# Patient Record
Sex: Male | Born: 1955 | Race: White | Hispanic: No | Marital: Single | Smoking: Never smoker
Health system: Southern US, Community
[De-identification: ages and names within clinical notes are randomized; demographics above are authoritative.]

## PROBLEM LIST (undated history)

## (undated) DIAGNOSIS — I10 Essential (primary) hypertension: Secondary | ICD-10-CM

---

## 2006-12-31 ENCOUNTER — Emergency Department: Payer: Self-pay | Admitting: Emergency Medicine

## 2008-10-24 ENCOUNTER — Emergency Department: Payer: Self-pay | Admitting: Emergency Medicine

## 2008-10-29 ENCOUNTER — Ambulatory Visit: Payer: Self-pay | Admitting: Urology

## 2008-11-02 ENCOUNTER — Ambulatory Visit: Payer: Self-pay | Admitting: Urology

## 2008-11-14 ENCOUNTER — Emergency Department: Payer: Self-pay | Admitting: Emergency Medicine

## 2009-06-19 IMAGING — CT CT STONE STUDY
1 of 2 series · 15 of 32 positions shown, 19 images · non-contrast
Comparison: none

REASON FOR EXAM: flank pain
COMMENTS:

[Series 2: stone · axial · 0.69mm/px · z∈[-394,+2]mm · 15 of 150 slices shown, 19 images]
[im 12/150  soft-tissue]
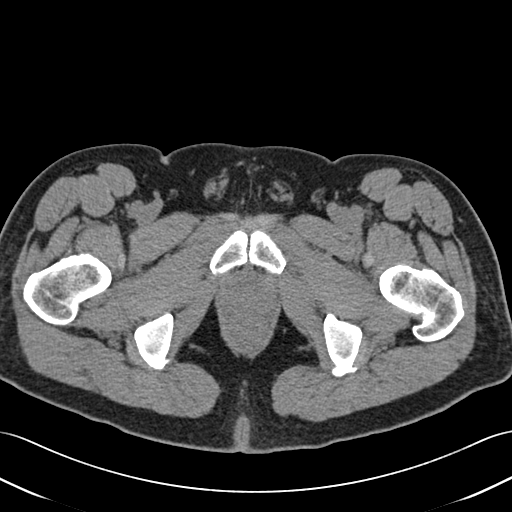
[im 12/150  bone]
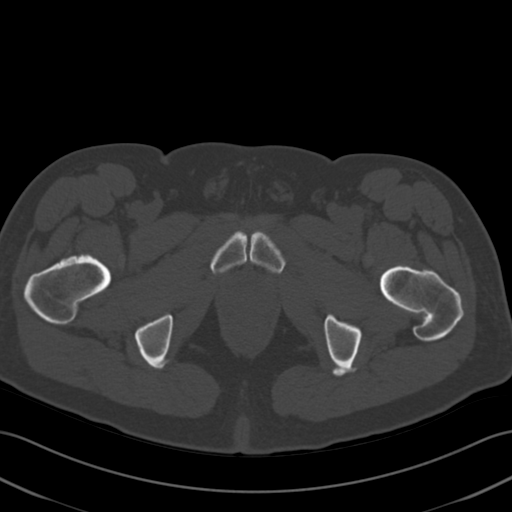
[im 23/150  soft-tissue]
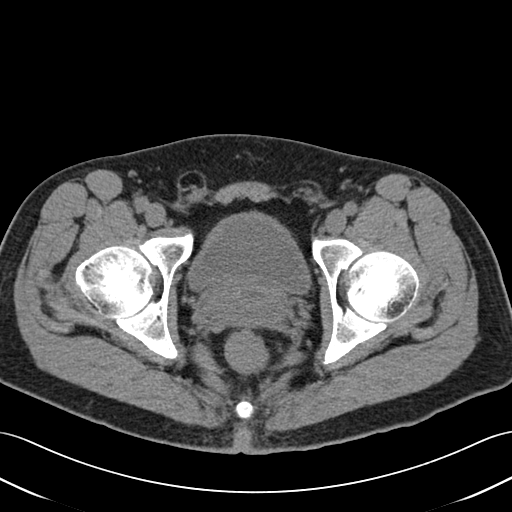
[im 34/150  soft-tissue]
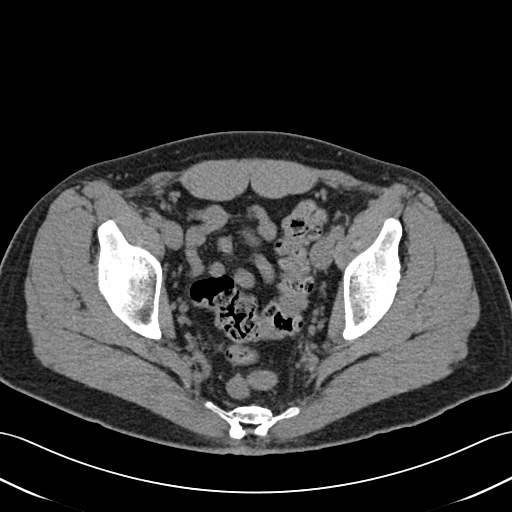
[im 45/150  soft-tissue]
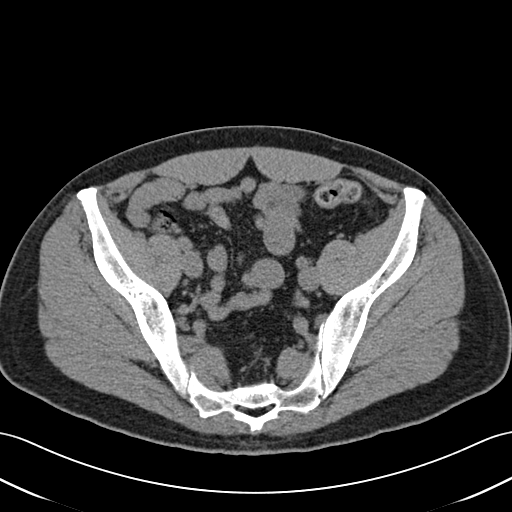
[im 56/150  soft-tissue]
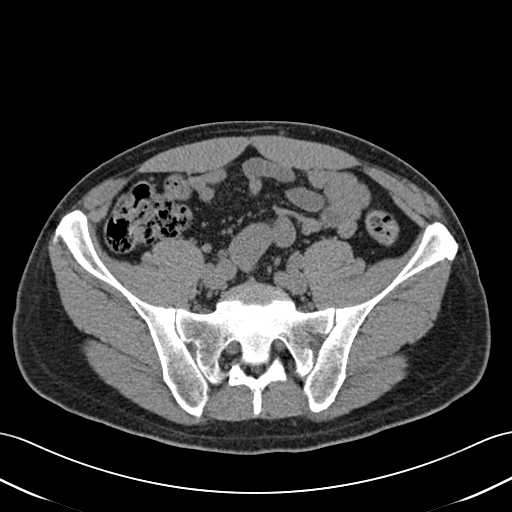
[im 67/150  soft-tissue]
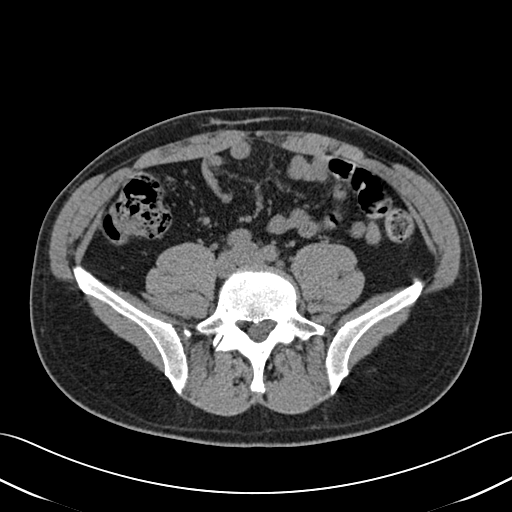
[im 78/150  soft-tissue]
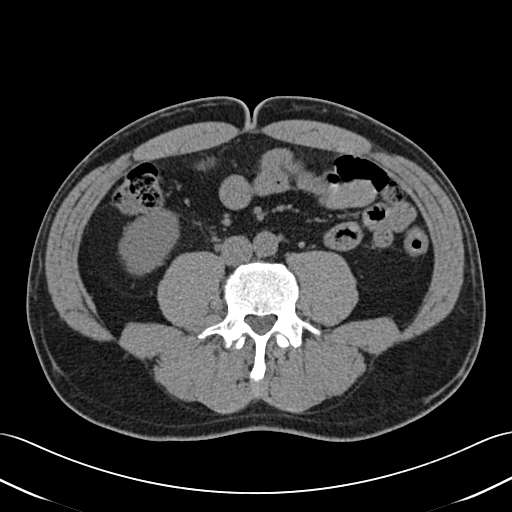
[im 89/150  soft-tissue]
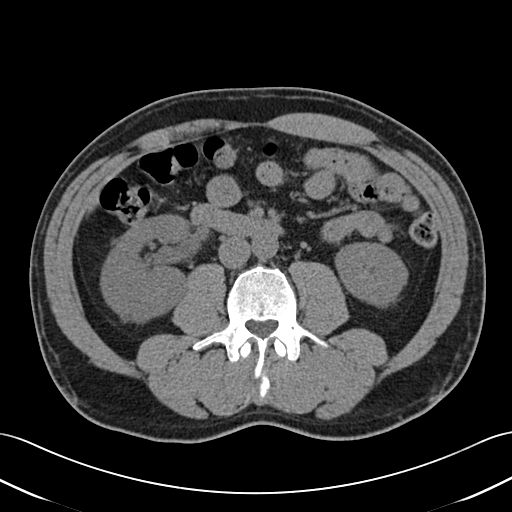
[im 100/150  soft-tissue]
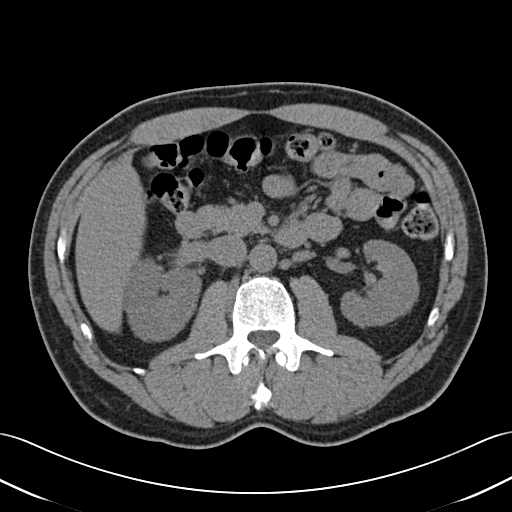
[im 100/150  bone]
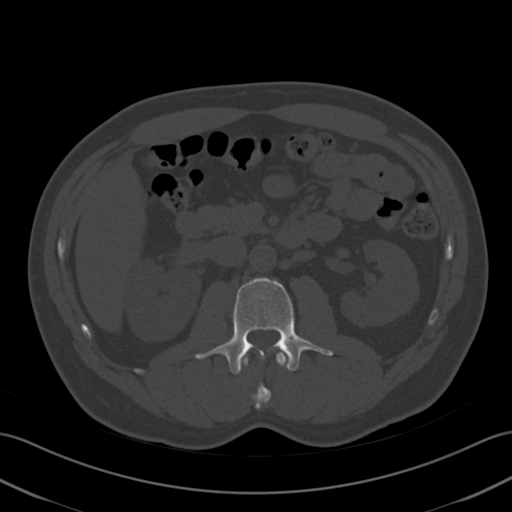
[im 111/150  soft-tissue]
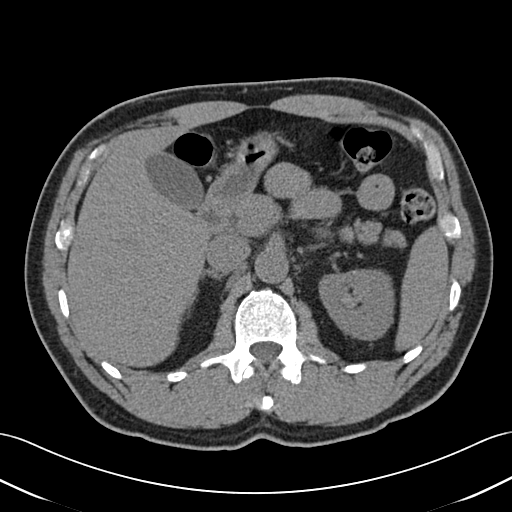
[im 122/150  soft-tissue]
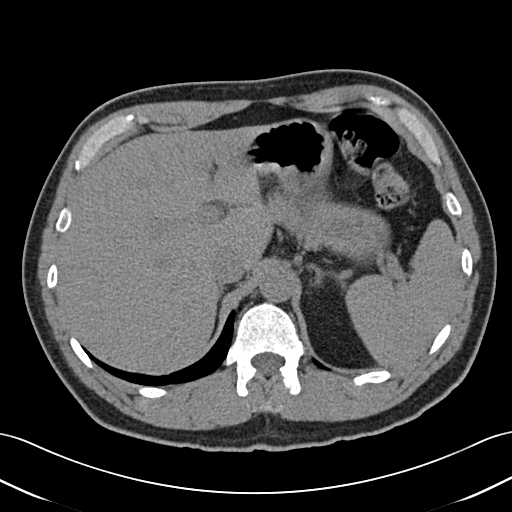
[im 127/150  lung]
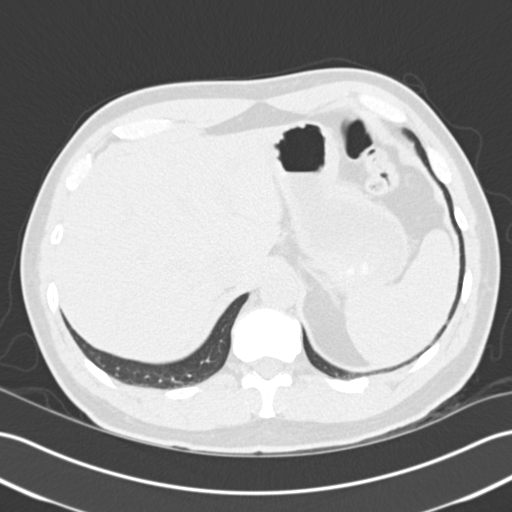
[im 133/150  soft-tissue]
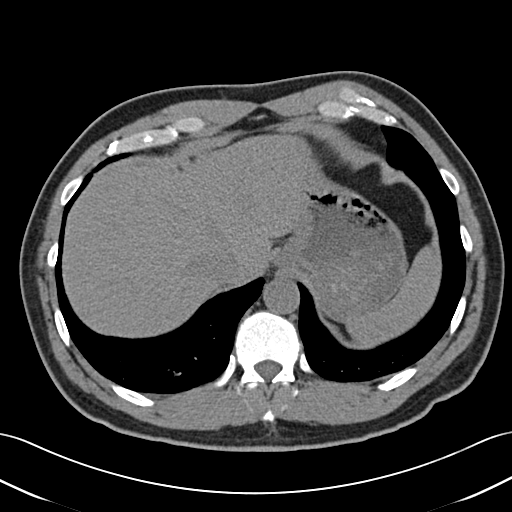
[im 133/150  lung]
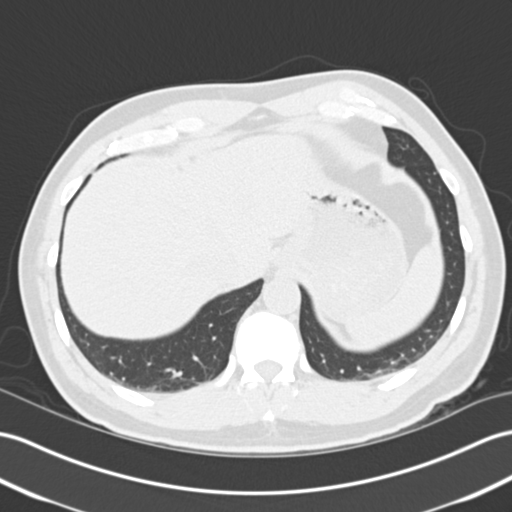
[im 138/150  lung]
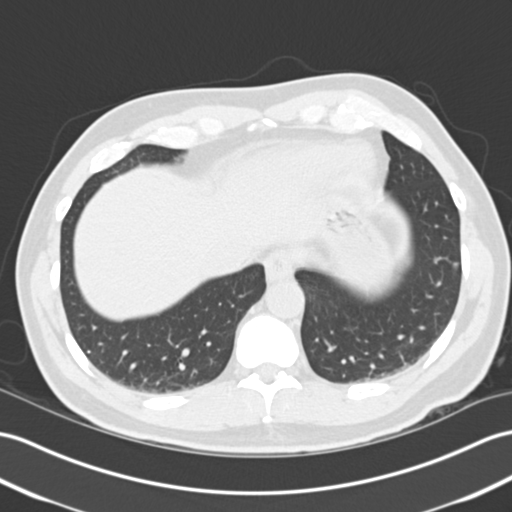
[im 144/150  soft-tissue]
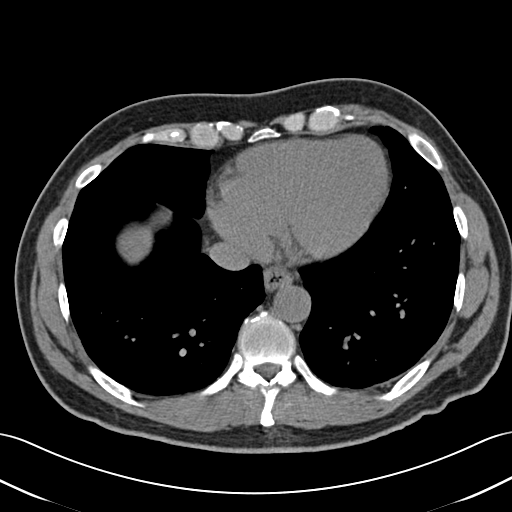
[im 144/150  lung]
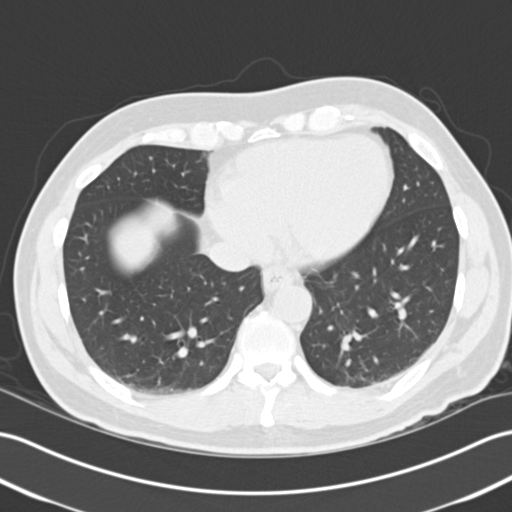

[15 of 32 positions shown; findings below may reference images not displayed]

PROCEDURE:     CT  - CT ABDOMEN /PELVIS WO (STONE)  - October 24, 2008  [DATE]

RESULT:     Helical 3 mm sections were obtained from the lung bases through
the pubic symphysis without the administration of oral or intravenous
contrast.

Evaluation of the lung bases demonstrates no gross abnormalities.

Within the limitations of a non-contrast CT, the liver, spleen, adrenals,
pancreas, and LEFT kidney are unremarkable. Evaluation of the RIGHT kidney
demonstrates mild hydronephrosis. Non-obstructing medullary calculi are
identified within the midpole portion of the kidney measuring 3 to 5 mm in
diameter. Within the proximal RIGHT ureter a 9 x 6 mm calculus is
appreciated. There is no CT evidence of bowel obstruction, abdominal aortic
aneurysm, loculated fluid collections or secondary signs reflecting the
sequela of enteritis, appendicitis or diverticulitis.
IMPRESSION: 1. Proximal RIGHT ureteral calculus with associated mild hydronephrosis.

2. Non-obstructing medullary calculi within the RIGHT kidney.

Dr. Hemije of the Emergency Department was informed of these findings via a
preliminary faxed report on 10-24-08 at [DATE] a.m. Central Standard Time.

## 2009-06-28 IMAGING — CR DG ABDOMEN 1V
1 series · 2 of 2 positions shown · non-contrast
Comparison: none

REASON FOR EXAM: nephrolithiasis
COMMENTS:

PROCEDURE:     DXR - DXR KIDNEY URETER BLADDER  - November 02, 2008  [DATE]
RESULT:     Comparisons:  CT abdomen 10/24/2008

[Series 1: view not recorded · 0.17mm/px · 2 of 2 slices shown]
[im 1/2]
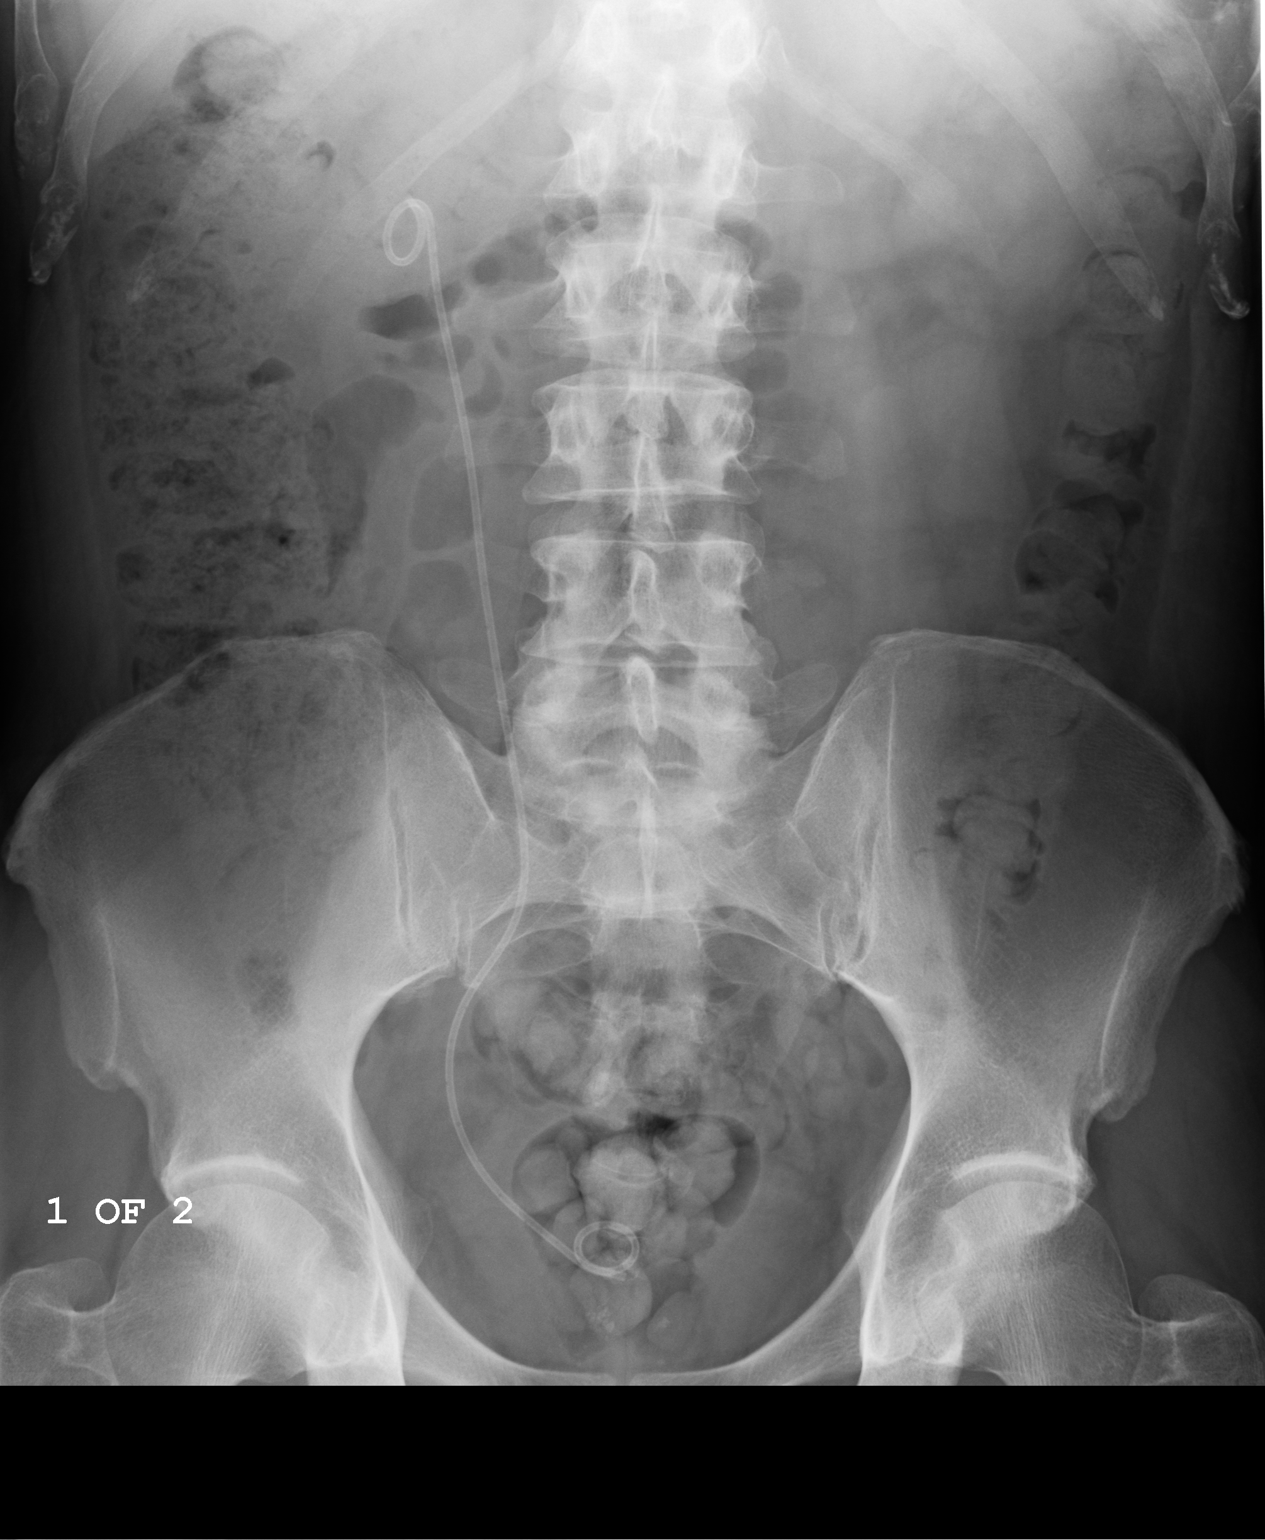
[im 2/2]
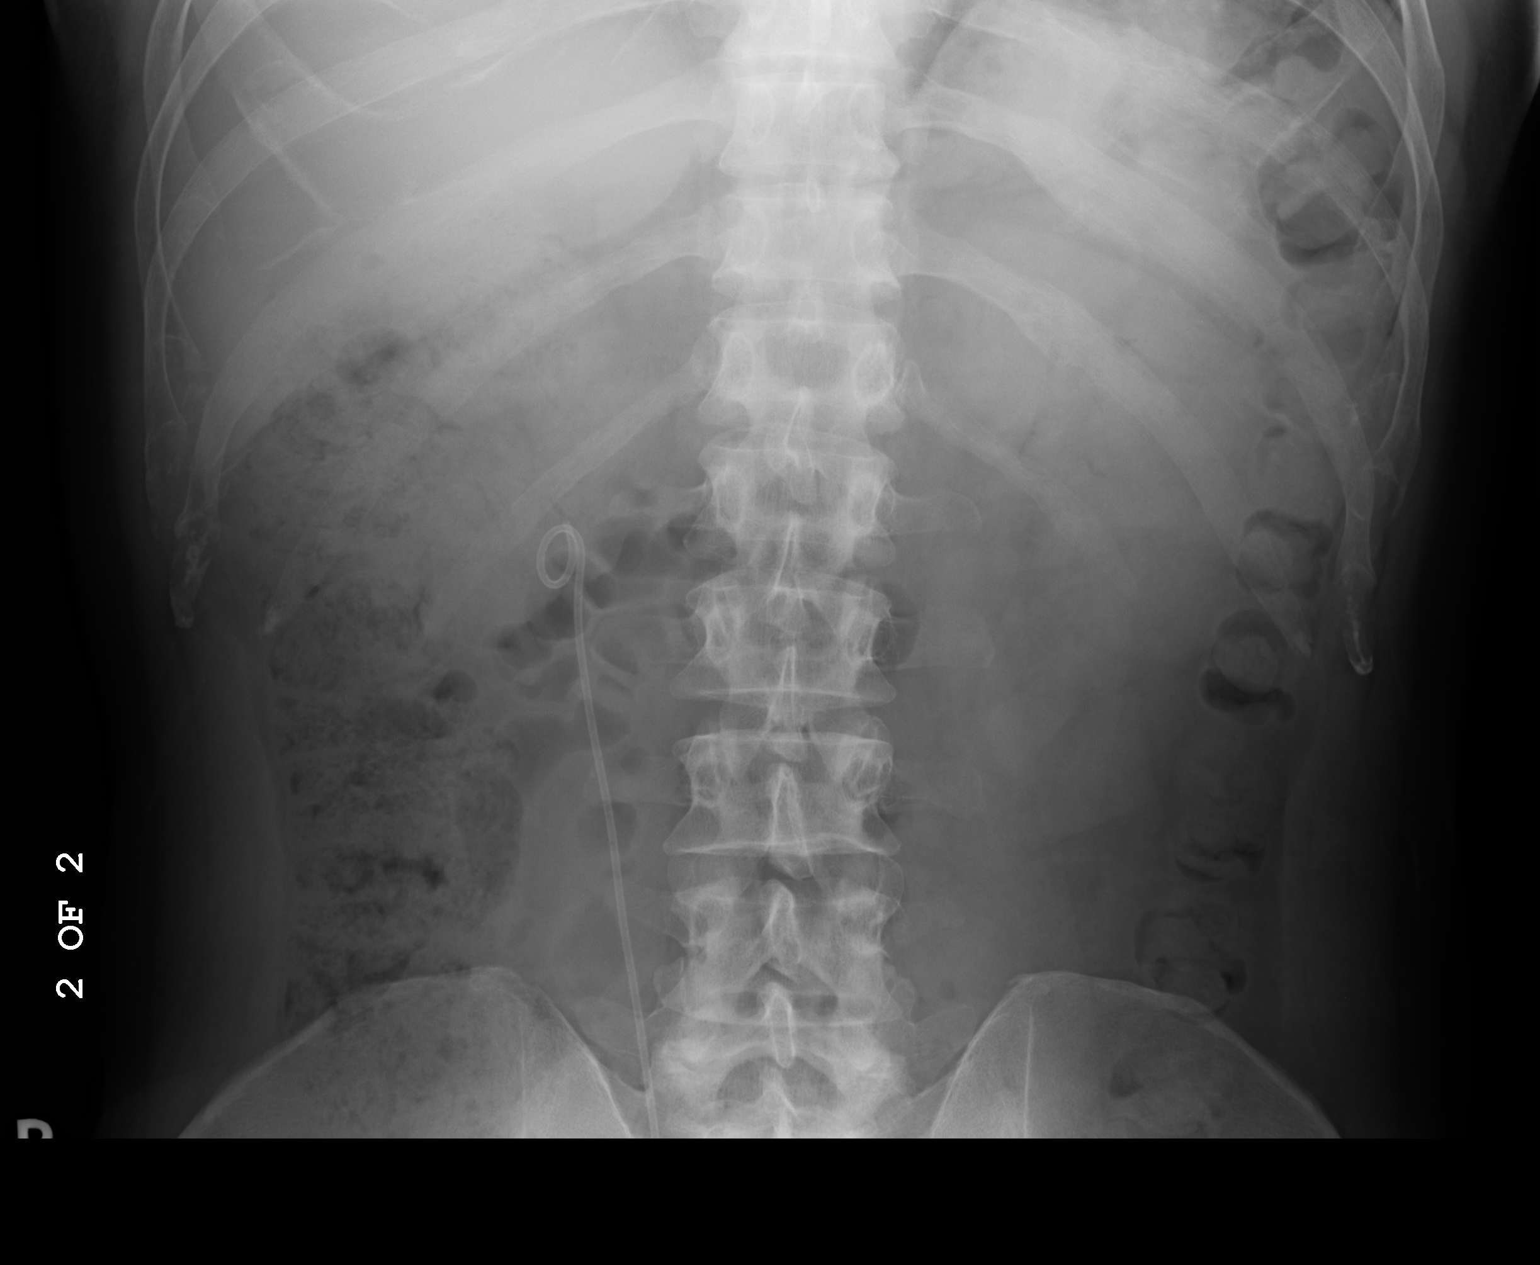

[2 of 2 positions shown; findings below may reference images not displayed]

FINDINGS: A single AP supine view of the abdomen is provided.

There is a nonspecific bowel gas pattern. There is no bowel dilatation to
suggest obstruction. There is no evidence of pneumoperitoneum, portal venous
gas, or pneumatosis.

There is a right-sided nephroureteral stent present. There are no pathologic
calcifications along the expected course of the ureter. There no pathologic
calcifications projecting over the kidneys. Prostatic calcifications are
noted.

The osseous structures are unremarkable.
IMPRESSION: There is a right-sided nephroureteral stent present. There are no pathologic
calcifications along the expected course of the ureter. There no pathologic
calcifications projecting over the kidneys.

## 2015-12-27 ENCOUNTER — Other Ambulatory Visit: Payer: Self-pay | Admitting: Internal Medicine

## 2015-12-27 DIAGNOSIS — R109 Unspecified abdominal pain: Secondary | ICD-10-CM

## 2015-12-31 ENCOUNTER — Ambulatory Visit
Admission: RE | Admit: 2015-12-31 | Discharge: 2015-12-31 | Disposition: A | Discharge status: Home / Self Care | Payer: Self-pay | Source: Ambulatory Visit | Attending: Internal Medicine | Admitting: Internal Medicine

## 2015-12-31 DIAGNOSIS — N281 Cyst of kidney, acquired: Secondary | ICD-10-CM | POA: Insufficient documentation

## 2015-12-31 DIAGNOSIS — R109 Unspecified abdominal pain: Secondary | ICD-10-CM | POA: Insufficient documentation

## 2015-12-31 DIAGNOSIS — N289 Disorder of kidney and ureter, unspecified: Secondary | ICD-10-CM | POA: Insufficient documentation

## 2016-03-20 ENCOUNTER — Encounter (INDEPENDENT_AMBULATORY_CARE_PROVIDER_SITE_OTHER): Payer: Self-pay | Admitting: Ophthalmology

## 2017-04-30 IMAGING — US US ABDOMEN COMPLETE
1 series · 13 of 25 positions shown · non-contrast
Comparison: Abdominal and pelvic CT scan October 24, 2008

CLINICAL DATA: Right-sided abdominal pain radiating from the right
flank to the mid pelvis for the past 6 weeks ; history of urinary
tract stones.

EXAM:
ABDOMEN ULTRASOUND COMPLETE

[Series 1: us abdomen complete · 0.22mm/px · 13 of 108 slices shown]
[im 1/108]
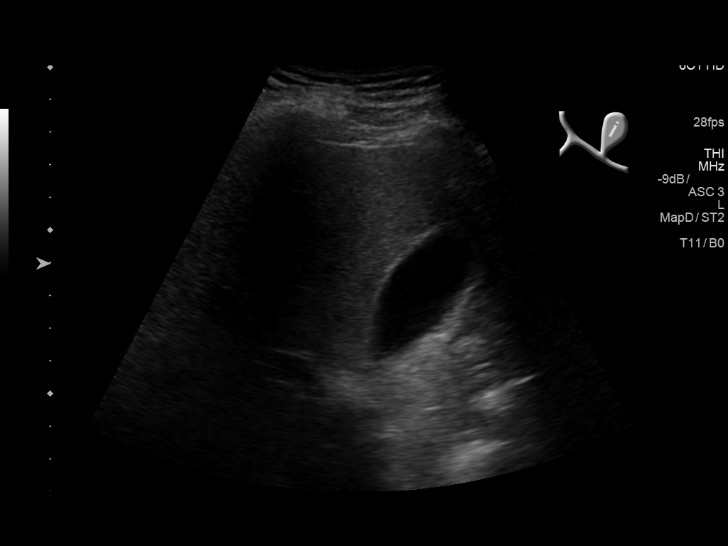
[im 9/108]
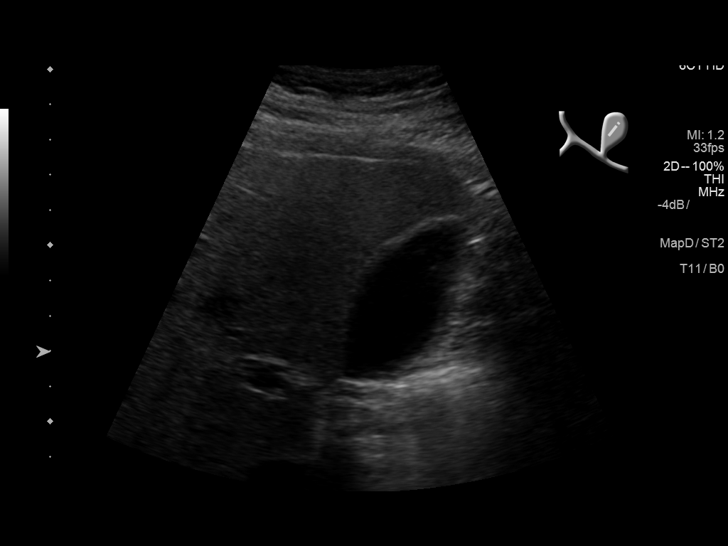
[im 18/108]
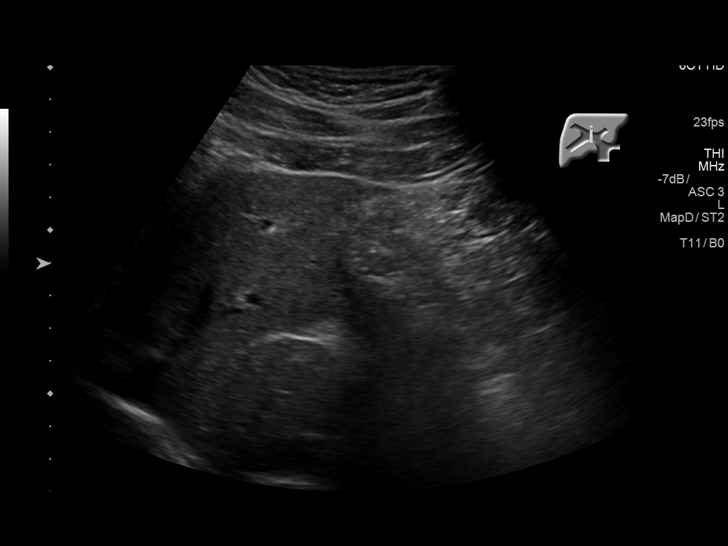
[im 27/108]
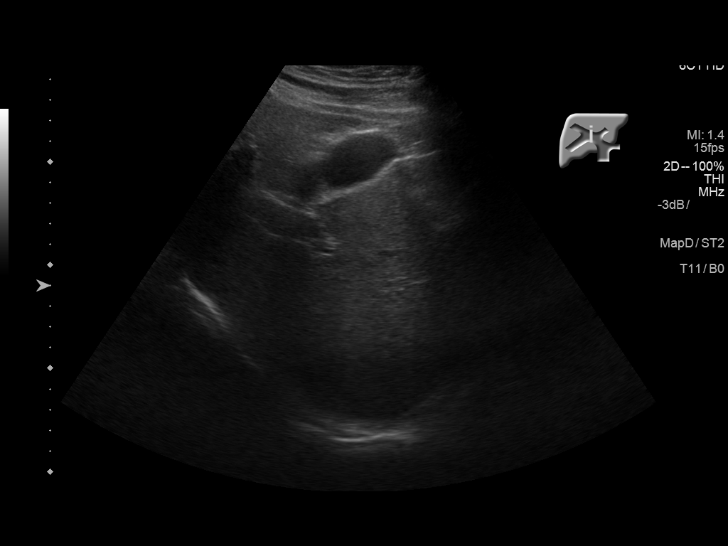
[im 36/108]
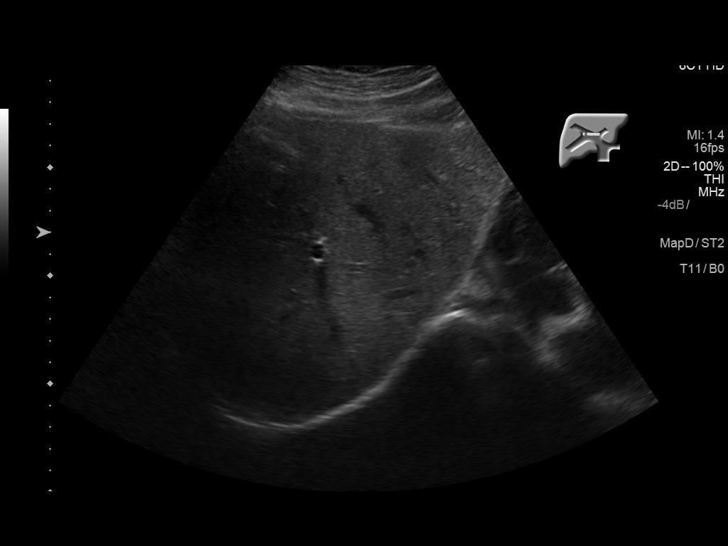
[im 45/108]
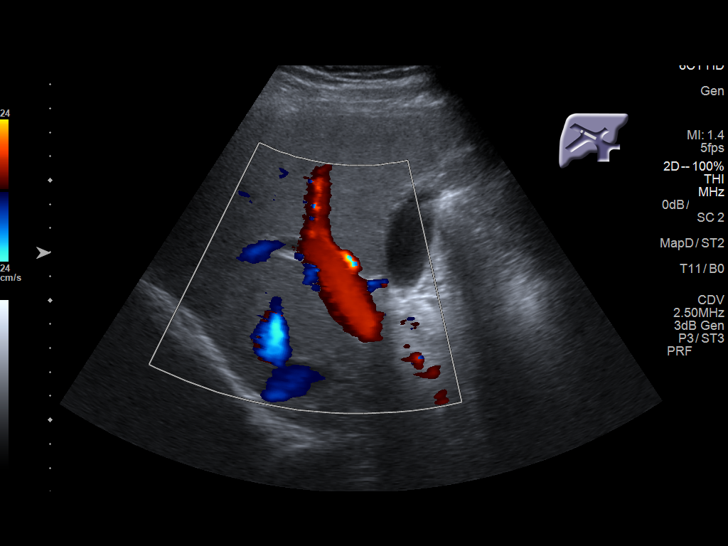
[im 54/108]
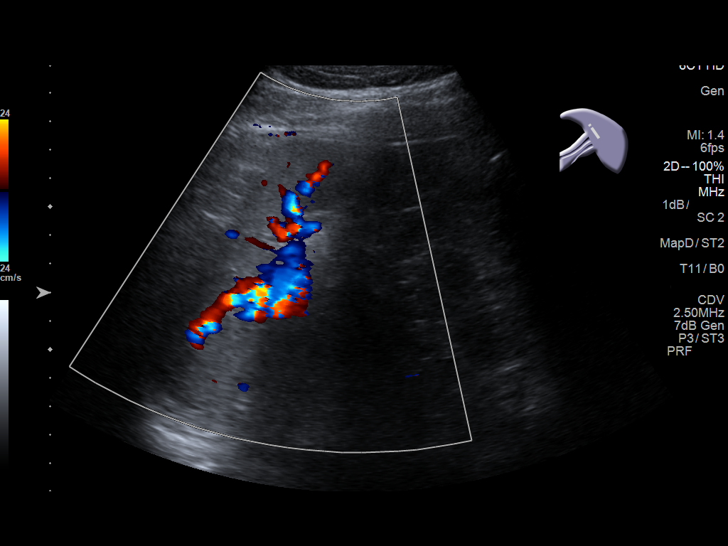
[im 63/108]
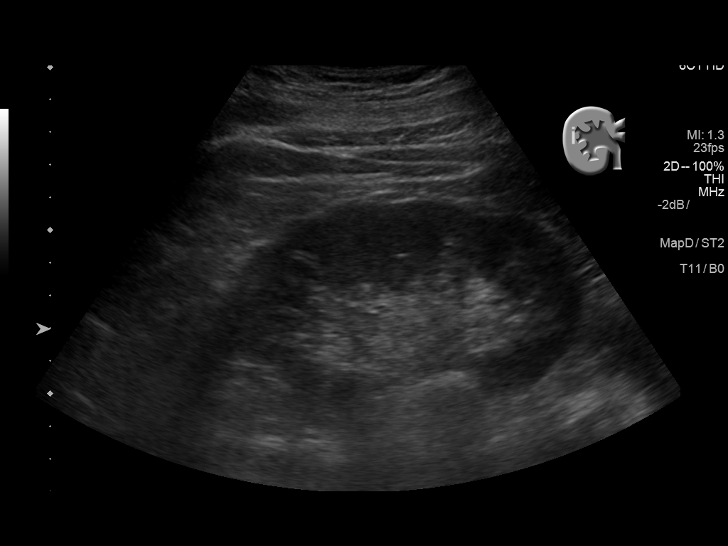
[im 72/108]
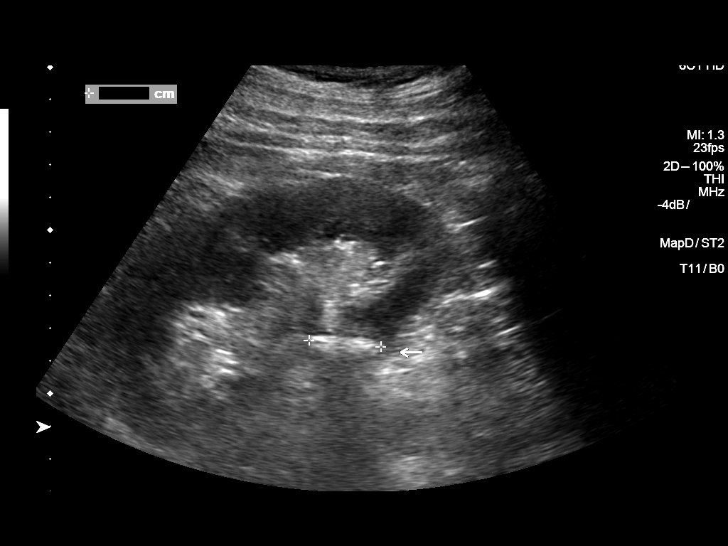
[im 81/108]
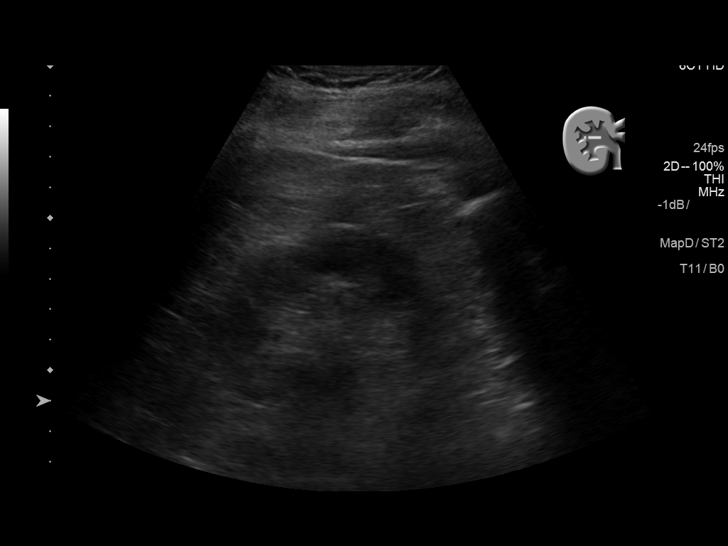
[im 90/108]
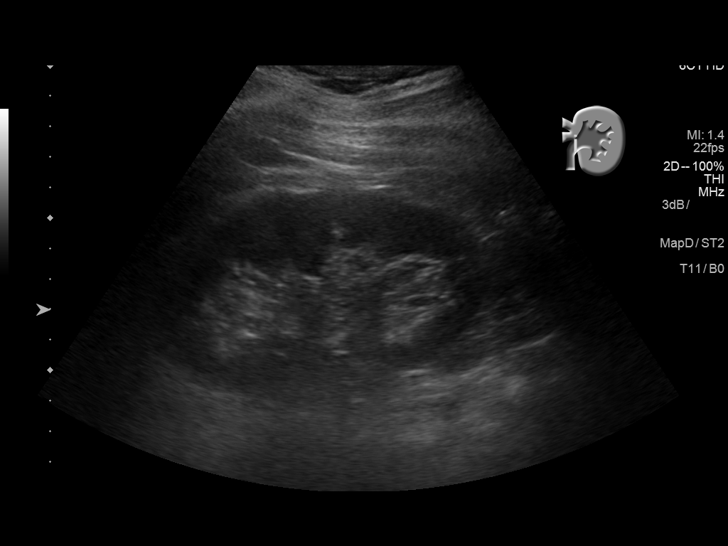
[im 99/108]
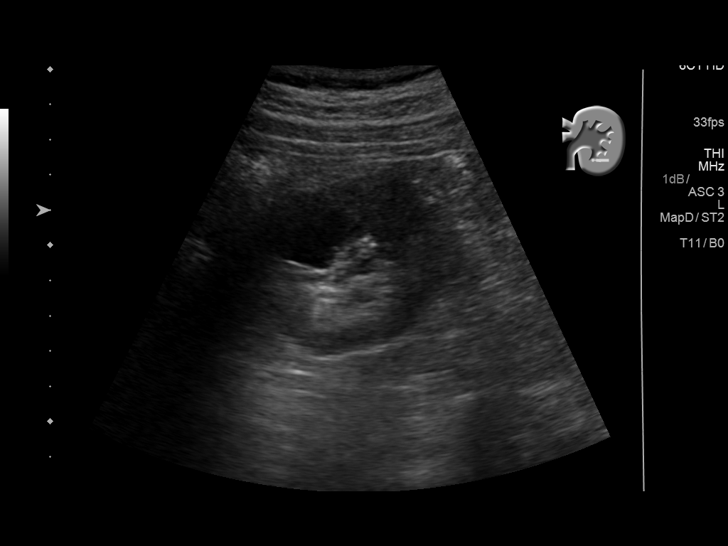
[im 108/108]
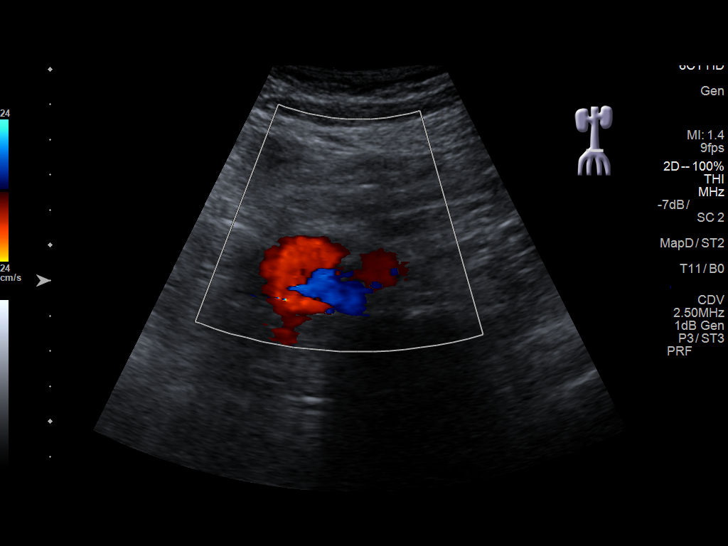

[13 of 25 positions shown; findings below may reference images not displayed]

FINDINGS: Gallbladder: No gallstones or wall thickening visualized. No
sonographic Murphy sign noted by sonographer.

Common bile duct: Diameter: 3.2 mm

Liver: No focal lesion identified. Within normal limits in
parenchymal echogenicity.

IVC: No abnormality visualized.

Pancreas: Visualized portion unremarkable.

Spleen: Size and appearance within normal limits.

Right Kidney: Length: 11.2 cm. The cortical echotexture is normal.
In the renal pelvis extending towards the UPJ there is an echogenic
shadowing focus measuring as much as 2.2 cm which may reflect a
nonobstructing stone.

Left Kidney: Length: 11.9 cm. The cortical echotexture is normal.
There is a lower pole cyst measuring 2.3 x 1.7 x 1.8 cm.

Abdominal aorta: No aneurysm visualized.

Other findings: There is no ascites.
IMPRESSION: 1. Possible in a nonobstructing stone in the right renal pelvis.
Given the patient's history of stones and the current symptoms, a CT
urogram is recommended.
2. No acute abnormality observed elsewhere within the abdomen.

## 2021-03-07 ENCOUNTER — Emergency Department
Admission: EM | Admit: 2021-03-07 | Discharge: 2021-03-07 | Disposition: A | Discharge status: Home / Self Care | Payer: Self-pay | Attending: Emergency Medicine | Admitting: Emergency Medicine

## 2021-03-07 ENCOUNTER — Other Ambulatory Visit: Payer: Self-pay

## 2021-03-07 ENCOUNTER — Encounter: Payer: Self-pay | Admitting: Emergency Medicine

## 2021-03-07 DIAGNOSIS — I10 Essential (primary) hypertension: Secondary | ICD-10-CM | POA: Insufficient documentation

## 2021-03-07 HISTORY — DX: Essential (primary) hypertension: I10

## 2021-03-07 NOTE — ED Triage Notes (Addendum)
Pt states his blood pressure is high and he "wants it checked because I am getting ready to go out of town". Pt states was diagnosed with htn four years ago, but is not on daily medication currently. Pt denies chestpain, shob, dizziness.

## 2021-03-07 NOTE — ED Provider Notes (Signed)
[facility] Emergency Department Provider Note  ____________________________________________   Event Date/Time   First MD Initiated Contact with Patient 03/07/21 [phone]     (approximate)  I have reviewed the triage vital signs and the nursing notes.   HISTORY  Chief Complaint Hypertension    HPI Randy Rivera is a 65 y.o. male who is generally healthy on no medications who presents for evaluation of hypertension.  He said he is getting ready to go on a trip and he took his blood pressure recently and found that it his been consistently elevated.  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  He has been told by his primary care doctor previously that his blood pressure is borderline but he has not ever been on any medications.  He denies any symptoms.  He denies fever/chills, sore throat, chest pain, shortness of breath, nausea, vomiting, abdominal pain, dizziness, changes in urinary habits, and any numbness or tingling in his extremities.  Blood pressure elevation was in his opinion severe with systolics in the 170s and a diastolic greater than 100.         Past Medical History:  Diagnosis Date  . Hypertension    borderline    There are no problems to display for this patient.   History reviewed. No pertinent surgical history.  Prior to Admission medications   Not on File    Allergies Patient has no known allergies.  History reviewed. No pertinent family history.  Social History Social History   Tobacco Use  . Smoking status: Never Smoker  . Smokeless tobacco: Never Used    Review of Systems Constitutional: Positive for hypertension.  No fever/chills Eyes: No visual changes. ENT: No sore throat. Cardiovascular: Denies chest pain. Respiratory: Denies shortness of breath. Gastrointestinal: No abdominal pain.  No nausea, no vomiting.  No  diarrhea.  No constipation. Genitourinary: Negative for dysuria. Musculoskeletal: Negative for neck pain.  Negative for back pain. Integumentary: Negative for rash. Neurological: Negative for headaches, focal weakness or numbness.   ____________________________________________   PHYSICAL EXAM:  VITAL SIGNS: ED Triage Vitals  Enc Vitals Group     BP 03/07/21 0402 (!) 178/107     Pulse Rate 03/07/21 0402 69     Resp 03/07/21 0402 18     Temp 03/07/21 0402 98 F (36.7 C)     Temp Source 03/07/21 0402 Oral     SpO2 03/07/21 0402 97 %     Weight 03/07/21 0407 89.8 kg (198 lb)     Height 03/07/21 0407 1.829 m (6')     Head Circumference --      Peak Flow --      Pain Score 03/07/21 0407 0     Pain Loc --      Pain Edu? --      Excl. in GC? --     Constitutional: Alert and oriented.  Eyes: Conjunctivae are normal.  Head: Atraumatic. Nose: No congestion/rhinnorhea. Mouth/Throat: Patient is wearing a mask. Neck: No stridor.  No meningeal signs.   Cardiovascular: Normal rate, regular rhythm. Good peripheral circulation. Respiratory: Normal respiratory effort.  No retractions. Gastrointestinal: Soft and nontender. No distention.  Musculoskeletal: No lower extremity tenderness nor edema. No gross deformities of extremities. Neurologic:  Normal speech and language. No gross focal neurologic deficits are appreciated.  Skin:  Skin is warm, dry and intact.  Psychiatric: Mood and affect are normal. Speech and behavior are normal.  ____________________________________________     INITIAL IMPRESSION / MDM / ASSESSMENT AND PLAN / ED COURSE  As part of my medical decision making, I reviewed the following data within the electronic MEDICAL RECORD NUMBER Nursing notes reviewed and incorporated, Old chart reviewed and Notes from prior ED visits   Differential diagnosis includes, but is not limited to, primary hypertension, hypertensive urgency/emergency, anxiety.  Believe the patient is  suffering from at least a degree of anxiety.  He most likely has primary hypertension as well but he is completely asymptomatic.  His blood pressure came down to about 158/92 while in the emergency department.  Given the lack of symptoms and variable blood pressure even while in the emergency department, I provided reassurance, encouraged him to try to relax and enjoy his vacation, and schedule a follow-up visit with his primary care doctor the next available opportunity.  I explained that typically diagnoses of hypertension require more than 1 measurement, and that it is better managed by his primary care provider.  He understands and agrees with the plan and is comfortable with outpatient follow-up.  I gave my usual and customary return precautions.           ____________________________________________  FINAL CLINICAL IMPRESSION(S) / ED DIAGNOSES  Final diagnoses:  Primary hypertension     MEDICATIONS GIVEN DURING THIS VISIT:  Medications - No data to display   ED Discharge Orders    None      *Please note:  XZAIVER VAYDA was evaluated in Emergency Department on 03/07/2021 for the symptoms described in the history of present illness. He was evaluated in the context of the global COVID-19 pandemic, which necessitated consideration that the patient might be at risk for infection with the SARS-CoV-2 virus that causes COVID-19. Institutional protocols and algorithms that pertain to the evaluation of patients at risk for COVID-19 are in a state of rapid change based on information released by regulatory bodies including the CDC and federal and state organizations. These policies and algorithms were followed during the patient's care in the ED.  Some ED evaluations and interventions may be delayed as a result of limited staffing during and after the pandemic.*  Note:  This document was prepared using Dragon voice recognition software and may include unintentional dictation errors.    Loleta Rose, MD 03/07/21 [phone]

## 2021-03-07 NOTE — ED Notes (Signed)
Spoke with dr. York Cerise regarding pt's chief complaint, no new orders received at this time.

## 2021-03-07 NOTE — Discharge Instructions (Addendum)

## 2023-01-12 ENCOUNTER — Other Ambulatory Visit: Payer: Self-pay

## 2023-01-12 ENCOUNTER — Emergency Department
Admission: EM | Admit: 2023-01-12 | Discharge: 2023-01-12 | Disposition: A | Discharge status: Home / Self Care | Payer: Medicare HMO | Attending: Emergency Medicine | Admitting: Emergency Medicine

## 2023-01-12 DIAGNOSIS — R079 Chest pain, unspecified: Secondary | ICD-10-CM | POA: Insufficient documentation

## 2023-01-12 DIAGNOSIS — I10 Essential (primary) hypertension: Secondary | ICD-10-CM

## 2023-01-12 LAB — CBC
HCT: 49.1 % (ref 39.0–52.0)
Hemoglobin: 16.7 g/dL (ref 13.0–17.0)
MCH: 31.2 pg (ref 26.0–34.0)
MCHC: 34 g/dL (ref 30.0–36.0)
MCV: 91.8 fL (ref 80.0–100.0)
Platelets: 226 10*3/uL (ref 150–400)
RBC: 5.35 MIL/uL (ref 4.22–5.81)
RDW: 12.4 % (ref 11.5–15.5)
WBC: 7.2 10*3/uL (ref 4.0–10.5)
nRBC: 0 % (ref 0.0–0.2)

## 2023-01-12 LAB — BASIC METABOLIC PANEL
Anion gap: 9 (ref 5–15)
BUN: 18 mg/dL (ref 8–23)
CO2: 25 mmol/L (ref 22–32)
Calcium: 9.3 mg/dL (ref 8.9–10.3)
Chloride: 103 mmol/L (ref 98–111)
Creatinine, Ser: 1.23 mg/dL (ref 0.61–1.24)
GFR, Estimated: >60 mL/min (ref >60)
Glucose, Bld: 96 mg/dL (ref 70–99)
Potassium: 4.3 mmol/L (ref 3.5–5.1)
Sodium: 137 mmol/L (ref 135–145)

## 2023-01-12 LAB — TROPONIN I (HIGH SENSITIVITY)
Troponin I (High Sensitivity): 8 ng/L (ref <18)
Troponin I (High Sensitivity): 7 ng/L (ref <18)

## 2023-01-12 NOTE — Discharge Instructions (Signed)
Continue to take your blood pressure record as we discussed to be able to discuss with your primary doctor to adjust your blood pressure medications as needed.  In terms of your chest discomfort it does not appear that you are having a heart attack at this time.  I wrote for a referral to a cardiologist to further investigate your chest pain and do further testing on your heart as needed.  Thank you for choosing [place] for your health care today!  Please see your primary doctor this week for a follow up appointment.   Sometimes, in the early stages of certain disease courses it is difficult to detect in the emergency department evaluation -- so, it is important that you continue to monitor your symptoms and call your doctor right away or return to the emergency department if you develop any new or worsening symptoms.  Please go to the following website to schedule new (and existing) patient appointments:   [email or web address]  If you do not have a primary doctor try calling the following clinics to establish care:  If you have insurance:  [facility] [phone] [address] [place] [place] [address]   [facility]  [phone] [address] [place] [place] [address]   If you do not have insurance:  [facility]  [phone] [address], [place] [address]   The following is another list of primary care offices in the area who are accepting new patients at this time.  Please reach out to one of them directly and let them know you would like to schedule an appointment to follow up on an Emergency Department visit, and/or to establish a new primary care provider (PCP).  There are likely other primary care clinics in the are who are accepting new patients, but this is an excellent place to start:  [facility] Lead physician: Dr Shirlee Latch [address] [place], [place]  [address] [phone]  [facility] Lead Physician: Dr Alba Cory [address], [place], [place] [address] [phone]  [facility]  Lead Physician: Dr Olevia Perches [address] [place], [place] [address] [phone]  [facility] Lead Physician: Dr Sofie Hartigan [address] [place], [place], [place] [address] [phone]  [facility] Lead Physician: Dr Bari Edward [address] [place], [place] [address] [phone]   It was my pleasure to care for you today.   Daneil Dan Modesto Charon, MD

## 2023-01-12 NOTE — ED Triage Notes (Signed)
Pt comes with c/o HTN. Pt states he takes meds for this. Pt states recently need meds added. Pt states he was at [place] today and got home checked is BP and it was in the 200s.  Current BP-202/113

## 2023-01-12 NOTE — ED Provider Notes (Signed)
[facility] Provider Note    Event Date/Time   First MD Initiated Contact with Patient 01/12/23 1531     (approximate)   History   Hypertension   HPI  Randy Rivera is a 67 y.o. male   Past medical history of Hypertension being managed by his primary doctor that refractory to his metoprolol twice a day and recently added hydralazine last week.    He presents emergency department with hypertension in the 200s systolic as well as an episode where he had some upper abdominal/lower chest discomfort and exertional while walking at the outlets today.    No associated nausea, no radiation of pain and completely resolved with rest.  He had no associated shortness of breath.    No complaints currently.    No drug or Alcohol use & non-smoker.   External Medical Documents Reviewed: Emergency department visit dated May 2022 for hypertension      Physical Exam   Triage Vital Signs: ED Triage Vitals  Enc Vitals Group     BP 01/12/23 1522 (!) 202/113     Pulse Rate 01/12/23 1522 74     Resp 01/12/23 1522 18     Temp 01/12/23 1522 98.1 F (36.7 C)     Temp Source 01/12/23 1522 Oral     SpO2 01/12/23 1522 99 %     Weight 01/12/23 1523 209 lb (94.8 kg)     Height 01/12/23 1523 6' (1.829 m)     Head Circumference --      Peak Flow --      Pain Score 01/12/23 1525 0     Pain Loc --      Pain Edu? --      Excl. in GC? --     Most recent vital signs: Vitals:   01/12/23 1522  BP: (!) 202/113  Pulse: 74  Resp: 18  Temp: 98.1 F (36.7 C)  SpO2: 99%    General: Awake, no distress.  CV:  Good peripheral perfusion.  Resp:  Normal effort.  Abd:  No distention.  Other:  Wake alert comfortable appearing with hypertension 200s over 100 otherwise vital signs normal with normal heart rate and temperature.  Patient appears well comfortable nontoxic with bilateral radial pulses equal and intact lungs clear to auscultation abdomen soft and nontender.  No  heart murmurs   ED Results / Procedures / Treatments   Labs (all labs ordered are listed, but only abnormal results are displayed) Labs Reviewed  CBC  BASIC METABOLIC PANEL  TROPONIN I (HIGH SENSITIVITY)  TROPONIN I (HIGH SENSITIVITY)     I ordered and reviewed the above labs they are notable for his initial troponin is 8  EKG  ED ECG REPORT I, Pilar Jarvis, the attending physician, personally viewed and interpreted this ECG.   Date: 01/12/2023  EKG Time: 1624  Rate: 64  Rhythm: nsr  Axis: nl  Intervals:none  ST&T Change: No acute ischemic changes      PROCEDURES:  Critical Care performed: No  Procedures   MEDICATIONS ORDERED IN ED: Medications - No data to display   IMPRESSION / MDM / ASSESSMENT AND PLAN / ED COURSE  I reviewed the triage vital signs and the nursing notes.                                Patient's presentation is most consistent with acute presentation with potential threat  to life or bodily function.  Differential diagnosis includes, but is not limited to, ACS, cardiac ischemia, PE, dissection, uncontrolled hypertension, other endorgan damage like stroke or kidney failure considered but less likely.    MDM: This is a patient with poorly controlled hypertension and has recently increased his medications per primary care recommendation.  He had a episode of exertional upper abdominal discomfort today that is completely resolved.  Given the symptoms concern for cardiac ischemic episode we will check EKG which fortunately is nonischemic and serial troponins.  Patient is completely asymptomatic at this time.     I considered observation for ongoing monitoring, however if serial troponins negative he continues to feel well I think outpatient follow-up most appropriate this time I will give him cardiology referral.  Patient is in agreement with this plan.  He understands return with any new or worsening symptoms.        FINAL CLINICAL  IMPRESSION(S) / ED DIAGNOSES   Final diagnoses:  Nonspecific chest pain  Uncontrolled hypertension     Rx / DC Orders   ED Discharge Orders          Ordered    Ambulatory referral to Cardiology        01/12/23 1617             Note:  This document was prepared using Dragon voice recognition software and may include unintentional dictation errors.    Pilar JarvisWong, Lonald Troiani, MD 01/12/23 [phone]

## 2023-02-19 DIAGNOSIS — R079 Chest pain, unspecified: Secondary | ICD-10-CM | POA: Insufficient documentation

## 2023-02-19 DIAGNOSIS — I10 Essential (primary) hypertension: Secondary | ICD-10-CM | POA: Insufficient documentation

## 2023-02-19 NOTE — Progress Notes (Unsigned)
Cardiology Office Note  Date:  02/21/2023   ID:  MALLIE CAROSELLA, DOB 1955-12-17, MRN [id number]  PCP:  Randy Shaggy, MD   Chief Complaint  Patient presents with   New Patient (Initial Visit)    Patient c/o elevated blood pressure, cramping in legs in the pm, shortness of breath and chest pain/discomfort. Medications reviewed by the patient verbally.     HPI:  [name] is a 67 year old gentleman with past medical history of Hypertension Who presents by referral from Dr. Renaldo Rivera in the ER for nonspecific chest pain  Long hx of HTN BP elevated on metoprolol tartrate Started on hydralazine 10 twice daily Blood pressure continues to run high  Recently walking around shopping center, "felt funny" Presenting to the ER January 12, 2023 with blood pressure 200 systolic, abdominal, lower chest discomfort  BP at home: 150-160/85 to 111  Active at baseline Works in [employer] school No sx on exertion Gained weight, less exercise, higher calorie intake  EKG personally reviewed by myself on todays visit NSR rate 71 bpm no significant ST or T wave changes  Family hx: Father with MI  Total chol 203, LDL 120s  PMH:   has a past medical history of Hypertension.  PSH:   No past surgical history on file.  Current Outpatient Medications  Medication Sig Dispense Refill   aspirin EC 325 MG tablet Take 325 mg by mouth daily.     diphenhydrAMINE (BENADRYL) 25 mg capsule Take 25 mg by mouth every 6 (six) hours as needed.     hydrALAZINE (APRESOLINE) 10 MG tablet Take 10 mg by mouth 2 (two) times daily.     metoprolol tartrate (LOPRESSOR) 100 MG tablet Take 100 mg by mouth 2 (two) times daily.     No current facility-administered medications for this visit.     Allergies:   Silver sulfadiazine   Social History:  The patient  reports that he has never smoked. He has never used smokeless tobacco. He reports that he does not currently use alcohol. He reports that he does not  currently use drugs.   Family History:   family history is not on file.    Review of Systems: Review of Systems  Constitutional: Negative.   HENT: Negative.    Respiratory: Negative.    Cardiovascular: Negative.   Gastrointestinal: Negative.   Musculoskeletal: Negative.   Neurological: Negative.   Psychiatric/Behavioral: Negative.    All other systems reviewed and are negative.    PHYSICAL EXAM: VS:  BP (!) 160/90 (BP Location: Right Arm, Patient Position: Sitting, Cuff Size: Normal)   Pulse 71   Ht 6' (1.829 m)   Wt 216 lb 2 oz (98 kg)   SpO2 96%   BMI 29.31 kg/m  , BMI Body mass index is 29.31 kg/m. GEN: Well nourished, well developed, in no acute distress HEENT: normal Neck: no JVD, carotid bruits, or masses Cardiac: RRR; no murmurs, rubs, or gallops,no edema  Respiratory:  clear to auscultation bilaterally, normal work of breathing GI: soft, nontender, nondistended, + BS MS: no deformity or atrophy Skin: warm and dry, no rash Neuro:  Strength and sensation are intact Psych: euthymic mood, full affect   Recent Labs: 01/12/2023: BUN 18; Creatinine, Ser 1.23; Hemoglobin 16.7; Platelets 226; Potassium 4.3; Sodium 137    Lipid Panel No results found for: "CHOL", "HDL", "LDLCALC", "TRIG"    Wt Readings from Last 3 Encounters:  02/21/23 216 lb 2 oz (98 kg)  01/12/23 209  lb (94.8 kg)  03/07/21 198 lb (89.8 kg)       ASSESSMENT AND PLAN:  Problem List Items Addressed This Visit       Cardiology Problems   Essential hypertension - Primary   Relevant Medications   metoprolol tartrate (LOPRESSOR) 100 MG tablet   hydrALAZINE (APRESOLINE) 10 MG tablet   aspirin EC 325 MG tablet     Other   Chest pain of uncertain etiology   Essential hypertension We have recommended he stop the hydralazine and start irbesartan 300 mg daily Continue metoprolol tartrate 100 twice daily for now If blood pressure continues to run high, could transition from metoprolol to  tartrate to carvedilol Could also add amlodipine He will monitor blood pressure closely at home and call us with numbers  Hyperlipidemia Total cholesterol around 200 Recommended calcium scoring for risk stratification   Total encounter time more than 50 minutes  Greater than 50% was spent in counseling and coordination of care with the patient    Signed, Randy Rivera, M.D., Ph.D. [facility] [place], [place] [phone]

## 2023-02-21 ENCOUNTER — Encounter: Payer: Self-pay | Admitting: Cardiovascular Disease

## 2023-02-21 ENCOUNTER — Ambulatory Visit: Payer: Medicare HMO | Attending: Cardiovascular Disease | Admitting: Cardiovascular Disease

## 2023-02-21 VITALS — BP 160/90 | HR 71 | Ht 72.0 in | Wt 216.1 lb

## 2023-02-21 DIAGNOSIS — R0602 Shortness of breath: Secondary | ICD-10-CM

## 2023-02-21 DIAGNOSIS — R079 Chest pain, unspecified: Secondary | ICD-10-CM

## 2023-02-21 DIAGNOSIS — R002 Palpitations: Secondary | ICD-10-CM

## 2023-02-21 DIAGNOSIS — I10 Essential (primary) hypertension: Secondary | ICD-10-CM | POA: Diagnosis not present

## 2023-02-21 MED ORDER — IRBESARTAN 300 MG PO TABS: 300.0000 mg | ORAL_TABLET | Freq: Every day | ORAL | 3 refills | Status: DC

## 2023-02-21 NOTE — Patient Instructions (Addendum)
Information on CT coronary calcium score, $99  CT coronary calcium score.   - $99 out of pocket cost at the time of your test - Call [phone] to schedule at your convenience.  [facility] [address] [place], [place] [address]  Coronary CalciumScan A coronary calcium scan is an imaging test used to look for deposits of calcium and other fatty materials (plaques) in the inner lining of the blood vessels of the heart (coronary arteries). These deposits of calcium and plaques can partly clog and narrow the coronary arteries without producing any symptoms or warning signs. This puts a person at risk for a heart attack. This test can detect these deposits before symptoms develop. Tell a health care provider about: Any allergies you have. All medicines you are taking, including vitamins, herbs, eye drops, creams, and over-the-counter medicines. Any problems you or family members have had with anesthetic medicines. Any blood disorders you have. Any surgeries you have had. Any medical conditions you have. Whether you are pregnant or may be pregnant. What are the risks? Generally, this is a safe procedure. However, problems may occur, including: Harm to a pregnant woman and her unborn baby. This test involves the use of radiation. Radiation exposure can be dangerous to a pregnant woman and her unborn baby. If you are pregnant, you generally should not have this procedure done. Slight increase in the risk of cancer. This is because of the radiation involved in the test. What happens before the procedure? No preparation is needed for this procedure. What happens during the procedure? You will undress and remove any jewelry around your neck or chest. You will put on a hospital gown. Sticky electrodes will be placed on your chest. The electrodes will be connected to an electrocardiogram (ECG) machine to record a tracing of the electrical activity of  your heart. A CT scanner will take pictures of your heart. During this time, you will be asked to lie still and hold your breath for 2-3 seconds while a picture of your heart is being taken. The procedure may vary among health care providers and hospitals. What happens after the procedure? You can get dressed. You can return to your normal activities. It is up to you to get the results of your test. Ask your health care provider, or the department that is doing the test, when your results will be ready. Summary A coronary calcium scan is an imaging test used to look for deposits of calcium and other fatty materials (plaques) in the inner lining of the blood vessels of the heart (coronary arteries). Generally, this is a safe procedure. Tell your health care provider if you are pregnant or may be pregnant. No preparation is needed for this procedure. A CT scanner will take pictures of your heart. You can return to your normal activities after the scan is done. This information is not intended to replace advice given to you by your health care provider. Make sure you discuss any questions you have with your health care provider. Document Released: 03/23/2008 Document Revised: 08/14/2016 Document Reviewed: 08/14/2016 Elsevier Interactive Patient Education  2017 [employer]   Medication Instructions:  Please hold the hydralazine Start irbesartan 300 mg daily  Monitor   If you need a refill on your cardiac medications before your next appointment, please call your pharmacy.   Lab work: No new labs needed  Testing/Procedures: No new testing needed  Follow-Up: At [facility], you and your health needs  are our priority.  As part of our continuing mission to provide you with exceptional heart care, we have created designated Provider Care Teams.  These Care Teams include your primary Cardiologist (physician) and Advanced Practice Providers (APPs -  Physician Assistants and Nurse  Practitioners) who all work together to provide you with the care you need, when you need it.  You will need a follow up appointment in 12 months  Providers on your designated Care Team:   Nicolasa Ducking, NP Eula Listen, PA-C Cadence Fransico Michael, [place]  COVID-19 Vaccine Information can be found at: [email or web address] For questions related to vaccine distribution or appointments, please email [email or web address] or call [phone].

## 2024-01-26 ENCOUNTER — Other Ambulatory Visit: Payer: Self-pay | Admitting: Cardiovascular Disease

## 2024-04-23 ENCOUNTER — Other Ambulatory Visit: Payer: Self-pay | Admitting: Cardiovascular Disease

## 2024-05-15 ENCOUNTER — Encounter: Payer: Self-pay | Admitting: *Deleted

## 2024-05-15 ENCOUNTER — Telehealth: Payer: Self-pay | Admitting: *Deleted

## 2024-05-15 NOTE — Telephone Encounter (Signed)
 LMOVM to verify card hx.

## 2024-05-19 NOTE — Progress Notes (Signed)
 Cardiology Office Note  Date:  05/20/2024   ID:  Randy Rivera, DOB Dec 17, 1955, MRN [id number]  PCP:  Randy Honor BROCKS, MD   Chief Complaint  Patient presents with   12 month follow up     Patient c/o shortness of breath at times when just walking to get a drink out of the refrigerator with occasional irregular racing heart beats.     HPI:  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  Has been working out,  Exercises outside, runs and walks  No chest pain or shortness of breath on exertion when exercising Heart rate improves with rest  He has noticed after being in a sitting position if he gets up and walks to the kitchen will sometimes get winded  Weight trending up, wonder if symptoms is from weight gain  EKG personally reviewed by myself on todays visit EKG Interpretation Date/Time:  Tuesday May 20 2024 09:55:58 EDT Ventricular Rate:  57 PR Interval:  172 QRS Duration:  82 QT Interval:  414 QTC Calculation: 402 R Axis:   3  Text Interpretation: Sinus bradycardia When compared with ECG of 12-Jan-2023 16:24, No significant change was found Confirmed by Perla Lye [phone]) on 05/20/2024 10:23:38 AM   Family hx: Father with MI  Lab work reviewed Total chol 189, LDL 127 A1C 4.9   PMH:   has a past medical history of Hypertension.  PSH:   History reviewed. No pertinent surgical history.  Current Outpatient Medications  Medication Sig Dispense Refill   acetaminophen (TYLENOL) 500 MG tablet Take 1,000 mg by mouth every 4 (four) hours as needed.     amLODipine  (NORVASC ) 5 MG tablet Take 5 mg by mouth daily.     aspirin EC 325 MG tablet Take 325 mg by mouth daily.     diphenhydrAMINE  (BENADRYL) 25 mg capsule Take 25 mg by mouth every 6 (six) hours as needed.     escitalopram (LEXAPRO) 10 MG tablet Take 10 mg by mouth daily.     irbesartan  (AVAPRO ) 300 MG tablet TAKE 1 TABLET BY MOUTH EVERY DAY 30 tablet 0   metoprolol tartrate (LOPRESSOR) 100 MG tablet Take 100 mg by mouth 2 (two) times daily.     No current facility-administered medications for this visit.   Allergies:   Silver sulfadiazine   Social History:  The patient  reports that he has never smoked. He has never used smokeless tobacco. He reports that he does not currently use alcohol. He reports that he does not currently use drugs.   Family History:   family history includes Diabetes in his father; Yvone' disease in his sister; Heart attack (age of onset: 11) in his father; Ovarian cancer in his sister.   Review of Systems: Review of Systems  Constitutional: Negative.   HENT: Negative.    Respiratory: Negative.    Cardiovascular: Negative.   Gastrointestinal: Negative.   Musculoskeletal: Negative.   Neurological: Negative.   Psychiatric/Behavioral: Negative.    All other systems reviewed and are negative.   PHYSICAL EXAM: VS:  BP (!) 140/80 (BP Location: Left Arm, Patient Position: Sitting, Cuff Size: Normal)   Pulse (!) 57   Ht 6' (1.829 m)   Wt 211 lb  6 oz (95.9 kg)   SpO2 96%   BMI 28.67 kg/m  , BMI Body mass index is 28.67 kg/m. GEN: Well nourished, well developed, in no acute distress HEENT: normal Neck: no JVD, carotid bruits, or masses Cardiac: RRR; no murmurs, rubs, or gallops,no edema  Respiratory:  clear to auscultation bilaterally, normal work of breathing GI: soft, nontender, nondistended, + BS MS: no deformity or atrophy Skin: warm and dry, no rash Neuro:  Strength and sensation are intact Psych: euthymic mood, full affect  Recent Labs: No results found for requested labs within last 365 days.    Lipid Panel No results found for: CHOL, HDL, LDLCALC, TRIG    Wt  Readings from Last 3 Encounters:  05/20/24 211 lb 6 oz (95.9 kg)  02/21/23 216 lb 2 oz (98 kg)  01/12/23 209 lb (94.8 kg)     ASSESSMENT AND PLAN:  Problem List Items Addressed This Visit       Cardiology Problems   Essential hypertension - Primary   Relevant Medications   amLODipine  (NORVASC ) 5 MG tablet   Other Relevant Orders   EKG 12-Lead (Completed)     Other   Chest pain of uncertain etiology   Relevant Orders   EKG 12-Lead (Completed)   Other Visit Diagnoses       Palpitations       Relevant Orders   EKG 12-Lead (Completed)     SOB (shortness of breath)       Relevant Orders   EKG 12-Lead (Completed)      Essential hypertension Blood pressure is high end of normal range today. No changes made to the medications.  Recommend he check blood pressure at home and if it continues to run high could increase amlodipine  up to 10 daily or 5 twice daily  Hyperlipidemia Total cholesterol 189 Has calcium scoring for risk stratification planned May need medication for elevated score  Palpitations Continue metoprolol 100 twice daily For worsening symptoms could order Zio monitor  Signed, Velinda Lunger, M.D., Ph.D. [facility] [place], [place] [phone]

## 2024-05-20 ENCOUNTER — Ambulatory Visit: Payer: Self-pay | Attending: Cardiovascular Disease | Admitting: Cardiovascular Disease

## 2024-05-20 ENCOUNTER — Encounter: Payer: Self-pay | Admitting: Cardiovascular Disease

## 2024-05-20 VITALS — BP 140/80 | HR 57 | Ht 72.0 in | Wt 211.4 lb

## 2024-05-20 DIAGNOSIS — R079 Chest pain, unspecified: Secondary | ICD-10-CM | POA: Diagnosis not present

## 2024-05-20 DIAGNOSIS — I10 Essential (primary) hypertension: Secondary | ICD-10-CM | POA: Diagnosis not present

## 2024-05-20 DIAGNOSIS — R0602 Shortness of breath: Secondary | ICD-10-CM | POA: Diagnosis not present

## 2024-05-20 DIAGNOSIS — R002 Palpitations: Secondary | ICD-10-CM

## 2024-05-20 MED ORDER — IRBESARTAN 300 MG PO TABS: 300.0000 mg | ORAL_TABLET | Freq: Every day | ORAL | 3 refills | Status: AC

## 2024-05-20 MED ORDER — AMLODIPINE BESYLATE 5 MG PO TABS: 5.0000 mg | ORAL_TABLET | Freq: Every day | ORAL | 3 refills | Status: AC

## 2024-05-20 NOTE — Patient Instructions (Signed)

## 2024-05-24 ENCOUNTER — Other Ambulatory Visit: Payer: Self-pay | Admitting: Cardiovascular Disease
